# Patient Record
Sex: Male | Born: 2000 | Race: Black or African American | Hispanic: No | Marital: Single | Smoking: Never smoker
Health system: Southern US, Community
[De-identification: ages and names within clinical notes are randomized; demographics above are authoritative.]

## PROBLEM LIST (undated history)

## (undated) DIAGNOSIS — T7840XA Allergy, unspecified, initial encounter: Secondary | ICD-10-CM

## (undated) HISTORY — DX: Allergy, unspecified, initial encounter: T78.40XA

---
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
## 2020-07-25 ENCOUNTER — Other Ambulatory Visit: Payer: Self-pay

## 2020-07-25 ENCOUNTER — Emergency Department
Admission: EM | Admit: 2020-07-25 | Discharge: 2020-07-25 | Disposition: A | Discharge status: Home / Self Care | Payer: Self-pay | Source: Home / Self Care

## 2020-07-25 DIAGNOSIS — Z20822 Contact with and (suspected) exposure to covid-19: Secondary | ICD-10-CM

## 2020-07-25 NOTE — Discharge Instructions (Addendum)
Strategies to prevent and/or treat COVID-19:  Vitamin D3 5000 IU (125 mcg) daily Vitamin C 500 mg twice daily Zinc 50 to 75 mg daily  Pepcid 20 mg twice a day  Listerine type mouthwash 4 times a day   Given that your mother and brother tested positive, I am sure that your symptoms are coming from Covid infection

## 2020-07-25 NOTE — ED Triage Notes (Signed)
Patient presents to Urgent Care with complaints of upper-mid abdominal pian and covid exposure since 2-3 days ago. Patient reports he would like to be tested for covid today, has not been vaccinated.

## 2020-07-25 NOTE — ED Provider Notes (Signed)
[facility]    CSN: [id number] Arrival date & time: 07/25/20  1011      History   Chief Complaint Chief Complaint  Patient presents with  . Abdominal Pain    HPI Christopher Hale is a 19 y.o. male.   Initial [facility] visit  Patient presents to Urgent Care with complaints of upper-mid abdominal pian and covid exposure since 2-3 days ago. Patient reports he would like to be tested for covid today, has not been vaccinated.   Mother is here with Covid symptoms.  Brother visited recently and tested positive for Covid.     History reviewed. No pertinent past medical history.  There are no problems to display for this patient.   History reviewed. No pertinent surgical history.     Home Medications    Prior to Admission medications   Not on File    Family History Family History  Problem Relation Age of Onset  . Healthy Mother   . Asthma Father     Social History Social History   Tobacco Use  . Smoking status: Never Smoker  . Smokeless tobacco: Never Used  Substance Use Topics  . Alcohol use: Not Currently  . Drug use: Not on file     Allergies   Patient has no known allergies.   Review of Systems Review of Systems   Physical Exam Triage Vital Signs ED Triage Vitals  Enc Vitals Group     BP 07/25/20 1029 121/75     Pulse Rate 07/25/20 1029 62     Resp 07/25/20 1029 16     Temp 07/25/20 1029 98.4 F (36.9 C)     Temp Source 07/25/20 1029 Oral     SpO2 07/25/20 1029 99 %     Weight --      Height --      Head Circumference --      Peak Flow --      Pain Score 07/25/20 1027 4     Pain Loc --      Pain Edu? --      Excl. in GC? --    No data found.  Updated Vital Signs BP 121/75 (BP Location: Right Arm)   Pulse 62   Temp 98.4 F (36.9 C) (Oral)   Resp 16   SpO2 99%    Physical Exam Vitals and nursing note reviewed.  Constitutional:      Appearance: He is well-developed and normal weight.  HENT:     Mouth/Throat:      Mouth: Mucous membranes are moist.  Cardiovascular:     Rate and Rhythm: Normal rate and regular rhythm.     Heart sounds: Normal heart sounds.  Pulmonary:     Effort: Pulmonary effort is normal.     Breath sounds: Normal breath sounds.  Abdominal:     General: Abdomen is flat. Bowel sounds are normal.     Palpations: Abdomen is soft.     Tenderness: There is no abdominal tenderness.  Skin:    General: Skin is warm.  Neurological:     General: No focal deficit present.     Mental Status: He is alert and oriented to person, place, and time.  Psychiatric:        Mood and Affect: Mood normal.        Behavior: Behavior normal.      UC Treatments / Results  Labs (all labs ordered are listed, but only abnormal results are displayed) Labs Reviewed  NOVEL CORONAVIRUS, NAA    EKG   Radiology No results found.  Procedures Procedures (including critical care time)  Medications Ordered in UC Medications - No data to display  Initial Impression / Assessment and Plan / UC Course  I have reviewed the triage vital signs and the nursing notes.  Pertinent labs & imaging results that were available during my care of the patient were reviewed by me and considered in my medical decision making (see chart for details).    Final Clinical Impressions(s) / UC Diagnoses   Final diagnoses:  Exposure to COVID-19 virus     Discharge Instructions     Strategies to prevent and/or treat COVID-19:  Vitamin D3 5000 IU (125 mcg) daily Vitamin C 500 mg twice daily Zinc 50 to 75 mg daily  Pepcid 20 mg twice a day  Listerine type mouthwash 4 times a day   Given that your mother and brother tested positive, I am sure that your symptoms are coming from Covid infection     ED Prescriptions    None     PDMP not reviewed this encounter.   Elvina Sidle, MD 07/25/20 1121

## 2020-07-27 ENCOUNTER — Encounter: Payer: Self-pay | Admitting: *Deleted

## 2020-07-27 LAB — SARS-COV-2, NAA 2 DAY TAT

## 2020-07-27 LAB — NOVEL CORONAVIRUS, NAA: SARS-CoV-2, NAA: NOT DETECTED

## 2020-07-29 ENCOUNTER — Telehealth: Payer: Self-pay

## 2020-07-29 NOTE — Telephone Encounter (Signed)
Pt called clinic to request test results from 9/11 visit. Results reviewed copy of lab report placed at front desk for pt to pick up this afternoon.

## 2021-03-05 ENCOUNTER — Emergency Department
Admission: EM | Admit: 2021-03-05 | Discharge: 2021-03-05 | Disposition: A | Discharge status: Home / Self Care | Payer: Medicaid Other | Source: Home / Self Care | Attending: Family Medicine | Admitting: Family Medicine

## 2021-03-05 ENCOUNTER — Other Ambulatory Visit: Payer: Self-pay

## 2021-03-05 ENCOUNTER — Encounter: Payer: Self-pay | Admitting: Emergency Medicine

## 2021-03-05 ENCOUNTER — Emergency Department (INDEPENDENT_AMBULATORY_CARE_PROVIDER_SITE_OTHER): Payer: Medicaid Other

## 2021-03-05 DIAGNOSIS — M7989 Other specified soft tissue disorders: Secondary | ICD-10-CM

## 2021-03-05 DIAGNOSIS — S8392XA Sprain of unspecified site of left knee, initial encounter: Secondary | ICD-10-CM

## 2021-03-05 DIAGNOSIS — M79605 Pain in left leg: Secondary | ICD-10-CM

## 2021-03-05 LAB — POCT URINALYSIS DIP (MANUAL ENTRY)
Bilirubin, UA: NEGATIVE
Blood, UA: NEGATIVE
Glucose, UA: NEGATIVE mg/dL
Ketones, POC UA: NEGATIVE mg/dL
Leukocytes, UA: NEGATIVE
Nitrite, UA: NEGATIVE
Protein Ur, POC: NEGATIVE mg/dL
Spec Grav, UA: >=1.03 — AB (ref 1.010–1.025)
Urobilinogen, UA: 0.2 E.U./dL
pH, UA: 6 (ref 5.0–8.0)

## 2021-03-05 MED ORDER — PREDNISONE 20 MG PO TABS: ORAL_TABLET | ORAL | 0 refills | Status: DC
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
## 2021-03-05 NOTE — Discharge Instructions (Addendum)
Elevate leg.  Apply ice pack for 20 to 30 minutes, 3 to 4 times daily  Continue until pain and swelling decrease.  May take Tylenol as needed for pain.  Minimize activity until improved.

## 2021-03-05 NOTE — ED Triage Notes (Signed)
Played basketball last night - another player landed on his left leg -sharp pain to the left calf & knee  Ice to area  No OTC pain meds Increased pain w/ moment COVID 01/2019

## 2021-03-06 LAB — COMPLETE METABOLIC PANEL WITH GFR
AG Ratio: 1.3 (calc) (ref 1.0–2.5)
ALT: 22 U/L (ref 9–46)
AST: 16 U/L (ref 10–40)
Albumin: 4.3 g/dL (ref 3.6–5.1)
Alkaline phosphatase (APISO): 84 U/L (ref 36–130)
BUN: 11 mg/dL (ref 7–25)
CO2: 26 mmol/L (ref 20–32)
Calcium: 9.5 mg/dL (ref 8.6–10.3)
Chloride: 103 mmol/L (ref 98–110)
Creat: 0.9 mg/dL (ref 0.60–1.35)
GFR, Est African American: 140 mL/min/{1.73_m2} (ref >=60)
GFR, Est Non African American: 121 mL/min/{1.73_m2} (ref >=60)
Globulin: 3.2 g/dL (calc) (ref 1.9–3.7)
Glucose, Bld: 87 mg/dL (ref 65–99)
Potassium: 4.1 mmol/L (ref 3.5–5.3)
Sodium: 136 mmol/L (ref 135–146)
Total Bilirubin: 0.5 mg/dL (ref 0.2–1.2)
Total Protein: 7.5 g/dL (ref 6.1–8.1)

## 2021-03-06 LAB — CK: Total CK: 232 U/L — ABNORMAL HIGH (ref 44–196)

## 2021-06-14 ENCOUNTER — Emergency Department
Admission: EM | Admit: 2021-06-14 | Discharge: 2021-06-14 | Disposition: A | Discharge status: Home / Self Care | Payer: Medicaid Other | Source: Home / Self Care | Attending: Family Medicine | Admitting: Family Medicine

## 2021-06-14 ENCOUNTER — Emergency Department (INDEPENDENT_AMBULATORY_CARE_PROVIDER_SITE_OTHER): Payer: Medicaid Other

## 2021-06-14 ENCOUNTER — Other Ambulatory Visit: Payer: Self-pay

## 2021-06-14 ENCOUNTER — Encounter: Payer: Self-pay | Admitting: Emergency Medicine

## 2021-06-14 DIAGNOSIS — M25572 Pain in left ankle and joints of left foot: Secondary | ICD-10-CM

## 2021-06-14 DIAGNOSIS — S93492A Sprain of other ligament of left ankle, initial encounter: Secondary | ICD-10-CM | POA: Diagnosis not present

## 2021-06-14 MED ORDER — PREDNISONE 20 MG PO TABS: 20.0000 mg | ORAL_TABLET | Freq: Two times a day (BID) | ORAL | 0 refills | Status: DC

## 2021-06-14 NOTE — Discharge Instructions (Addendum)
Continue with ice and elevation to reduce the swelling Take ibuprofen 3 times a day with food.  This will reduce pain and inflammation Wear the brace to reduce risk of reinjury.  You need to wear this during sports for 4 to 6 weeks Stay off your leg while painful See sports medicine physician if you fail to improve.

## 2021-06-14 NOTE — ED Triage Notes (Signed)
Patient states that he rolled his left ankle about a week ago.  Patient is still having pain, swelling and tightness on top of his foot.  Denies any OTC pain meds.
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
## 2021-10-27 ENCOUNTER — Emergency Department (INDEPENDENT_AMBULATORY_CARE_PROVIDER_SITE_OTHER)
Admission: EM | Admit: 2021-10-27 | Discharge: 2021-10-27 | Disposition: A | Discharge status: Home / Self Care | Payer: Self-pay | Source: Home / Self Care | Attending: Family Medicine | Admitting: Family Medicine

## 2021-10-27 DIAGNOSIS — M7918 Myalgia, other site: Secondary | ICD-10-CM

## 2021-10-27 MED ORDER — PREDNISONE 20 MG PO TABS: 20.0000 mg | ORAL_TABLET | Freq: Two times a day (BID) | ORAL | 0 refills | Status: AC

## 2021-10-27 MED ORDER — METHOCARBAMOL 750 MG PO TABS: 750.0000 mg | ORAL_TABLET | Freq: Every evening | ORAL | 0 refills | Status: AC | PRN
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
## 2021-10-27 NOTE — Discharge Instructions (Signed)
Take prednisone 2 times a day Take the Robaxin at bedtime.  May take during the day if needed May use ice or heat to area Follow-up with sports medicine
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
## 2022-07-24 IMAGING — DX DG ANKLE COMPLETE 3+V*L*
3 series · 3 of 3 positions shown · non-contrast
Comparison: None.

CLINICAL DATA: Left ankle pain

EXAM:
LEFT ANKLE COMPLETE - 3+ VIEW

[ankle ap]
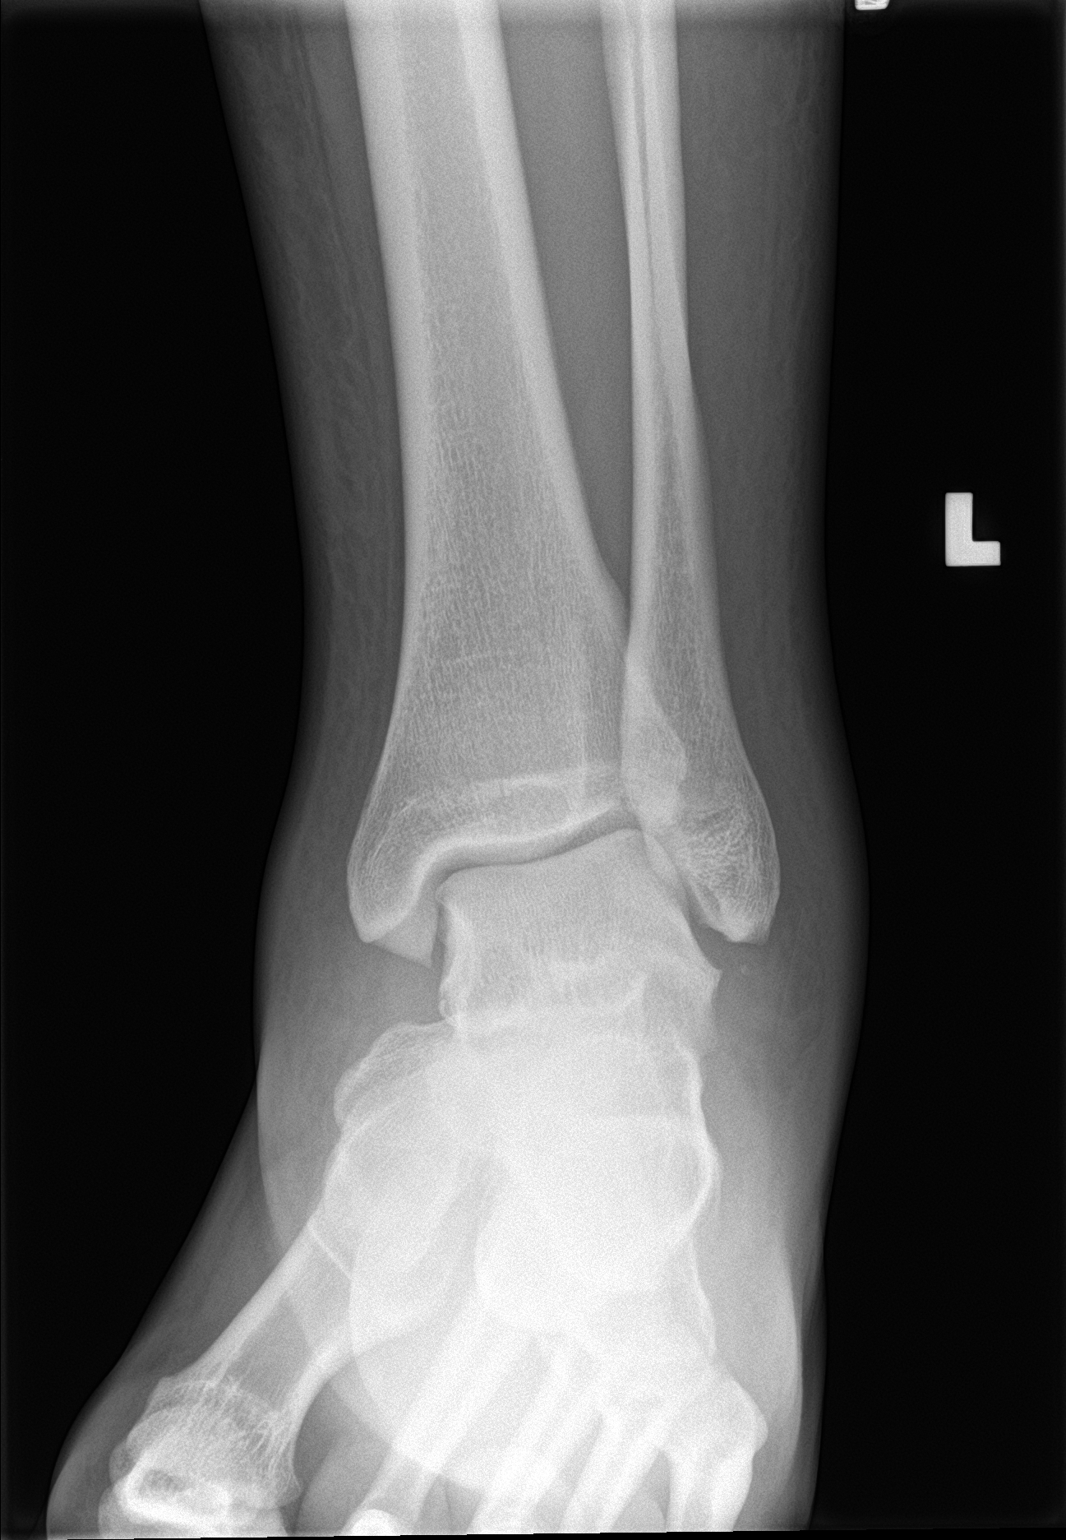

[ankle obl]
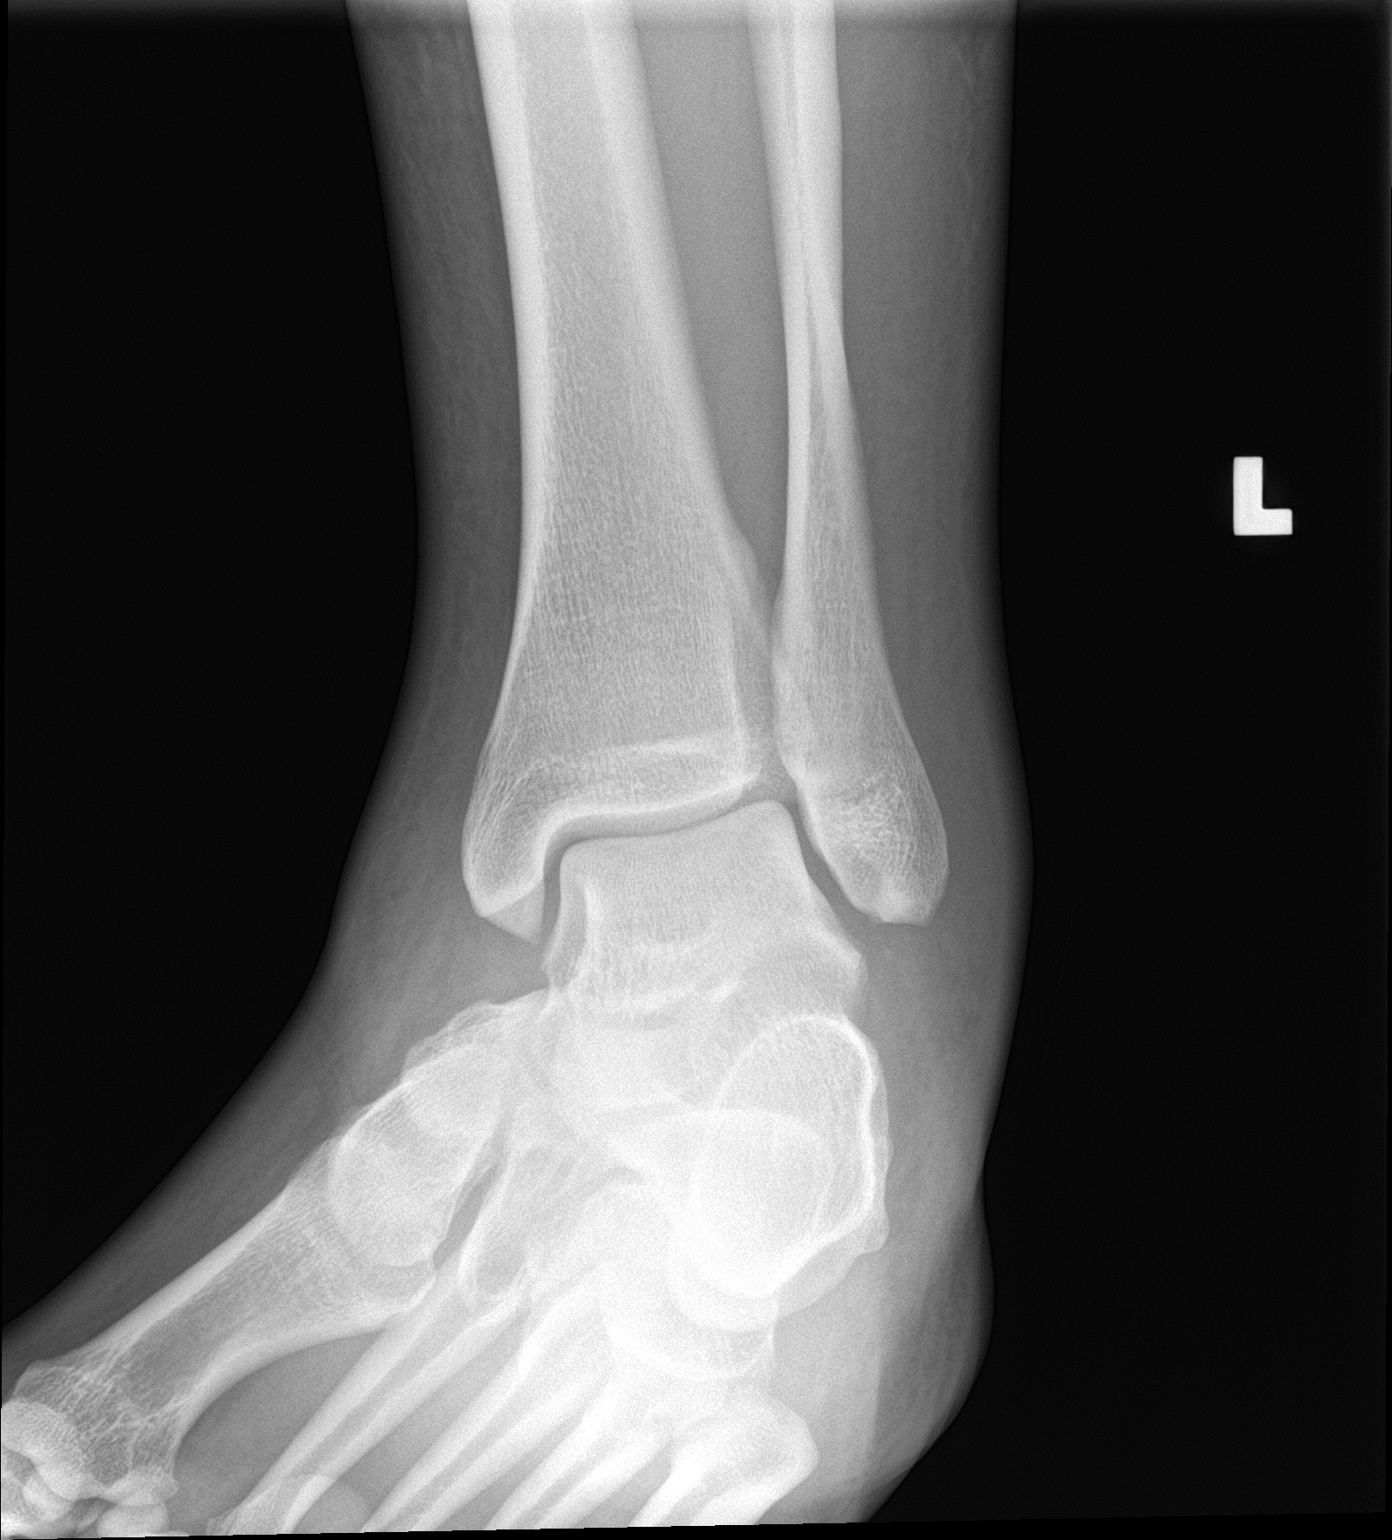

[ankle lat]
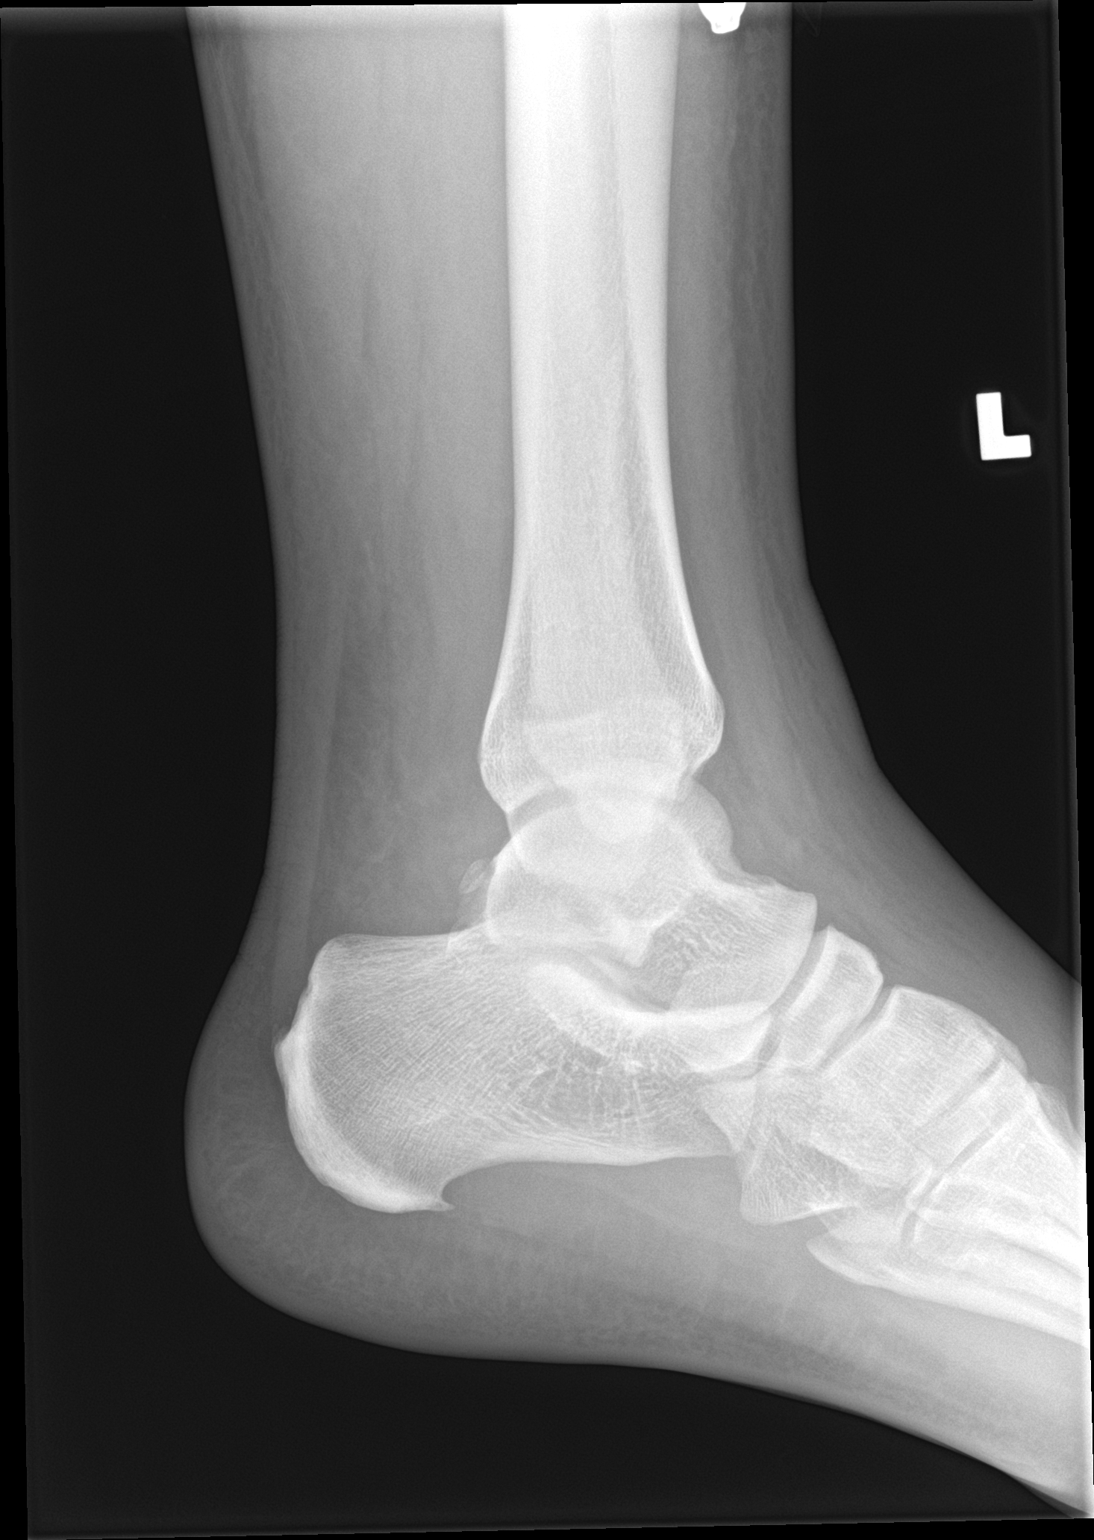

[3 of 3 positions shown; findings below may reference images not displayed]

FINDINGS: Very small subtle ossicle seen adjacent to the distal fibula.
Otherwise, no evidence of fracture. Joint spaces are well
maintained. Normal mineralization. Soft tissue swelling of the
ankle.
IMPRESSION: Very small subtle ossicle seen adjacent to the distal fibula,
possibly due to avulsion fracture versus sequela of remote prior
injury. Correlate for point tenderness. Otherwise, no evidence of
acute fracture.
